# Patient Record
Sex: Female | Born: 1960 | Race: White | Hispanic: No | Marital: Married | Smoking: Never smoker
Health system: Southern US, Community
[De-identification: ages and names within clinical notes are randomized; demographics above are authoritative.]

## PROBLEM LIST (undated history)

## (undated) DIAGNOSIS — I10 Essential (primary) hypertension: Secondary | ICD-10-CM

## (undated) HISTORY — PX: CHOLECYSTECTOMY: SHX55

---

## 1997-09-26 ENCOUNTER — Other Ambulatory Visit: Admission: RE | Admit: 1997-09-26 | Discharge: 1997-09-26 | Payer: Self-pay | Admitting: Obstetrics & Gynecology

## 1999-11-20 ENCOUNTER — Encounter: Admission: RE | Admit: 1999-11-20 | Discharge: 1999-11-20 | Payer: Self-pay | Admitting: Obstetrics and Gynecology

## 1999-11-20 ENCOUNTER — Encounter: Payer: Self-pay | Admitting: Obstetrics and Gynecology

## 2002-03-23 ENCOUNTER — Encounter: Payer: Self-pay | Admitting: Obstetrics and Gynecology

## 2002-03-23 ENCOUNTER — Encounter: Admission: RE | Admit: 2002-03-23 | Discharge: 2002-03-23 | Payer: Self-pay | Admitting: Obstetrics and Gynecology

## 2002-10-19 ENCOUNTER — Other Ambulatory Visit: Admission: RE | Admit: 2002-10-19 | Discharge: 2002-10-19 | Payer: Self-pay | Admitting: Obstetrics and Gynecology

## 2003-10-22 ENCOUNTER — Other Ambulatory Visit: Admission: RE | Admit: 2003-10-22 | Discharge: 2003-10-22 | Payer: Self-pay | Admitting: Obstetrics and Gynecology

## 2004-10-23 ENCOUNTER — Encounter: Admission: RE | Admit: 2004-10-23 | Discharge: 2004-10-23 | Payer: Self-pay | Admitting: Obstetrics and Gynecology

## 2004-11-11 ENCOUNTER — Encounter: Admission: RE | Admit: 2004-11-11 | Discharge: 2004-11-11 | Payer: Self-pay | Admitting: Obstetrics and Gynecology

## 2005-11-19 ENCOUNTER — Encounter: Admission: RE | Admit: 2005-11-19 | Discharge: 2005-11-19 | Payer: Self-pay | Admitting: Obstetrics and Gynecology

## 2006-12-16 ENCOUNTER — Encounter: Admission: RE | Admit: 2006-12-16 | Discharge: 2006-12-16 | Payer: Self-pay | Admitting: Obstetrics and Gynecology

## 2006-12-23 ENCOUNTER — Encounter: Admission: RE | Admit: 2006-12-23 | Discharge: 2006-12-23 | Payer: Self-pay | Admitting: Obstetrics and Gynecology

## 2007-12-19 ENCOUNTER — Encounter: Admission: RE | Admit: 2007-12-19 | Discharge: 2007-12-19 | Payer: Self-pay | Admitting: Obstetrics and Gynecology

## 2008-12-23 ENCOUNTER — Encounter: Admission: RE | Admit: 2008-12-23 | Discharge: 2008-12-23 | Payer: Self-pay | Admitting: Obstetrics and Gynecology

## 2009-04-14 ENCOUNTER — Ambulatory Visit: Payer: Self-pay | Admitting: Family Medicine

## 2009-04-14 DIAGNOSIS — I10 Essential (primary) hypertension: Secondary | ICD-10-CM | POA: Insufficient documentation

## 2009-04-14 DIAGNOSIS — J309 Allergic rhinitis, unspecified: Secondary | ICD-10-CM | POA: Insufficient documentation

## 2009-04-14 DIAGNOSIS — L259 Unspecified contact dermatitis, unspecified cause: Secondary | ICD-10-CM | POA: Insufficient documentation

## 2009-06-30 ENCOUNTER — Ambulatory Visit: Payer: Self-pay | Admitting: Emergency Medicine

## 2009-12-30 ENCOUNTER — Encounter
Admission: RE | Admit: 2009-12-30 | Discharge: 2009-12-30 | Payer: Self-pay | Source: Home / Self Care | Attending: Obstetrics and Gynecology | Admitting: Obstetrics and Gynecology

## 2010-01-08 ENCOUNTER — Encounter
Admission: RE | Admit: 2010-01-08 | Discharge: 2010-01-08 | Payer: Self-pay | Source: Home / Self Care | Attending: Obstetrics and Gynecology | Admitting: Obstetrics and Gynecology

## 2010-02-08 ENCOUNTER — Encounter: Payer: Self-pay | Admitting: Obstetrics and Gynecology

## 2010-02-17 NOTE — Assessment & Plan Note (Signed)
Summary: FLEA BITES/KH   Vital Signs:  Patient Profile:   50 Years Old Female CC:      Flea bites, palms of hands, bottom of feet x 3 days Height:     62.5 inches Weight:      118 pounds O2 Sat:      98 % O2 treatment:    Room Air Temp:     98.5 degrees F oral Pulse rate:   88 / minute Pulse rhythm:   regular Resp:     12 per minute BP sitting:   136 / 78  (right arm) Cuff size:   regular  Vitals Entered By: Emilio Math (April 14, 2009 2:56 PM)                  Current Allergies: ! BACTRIMHistory of Present Illness Chief Complaint: Flea bites, palms of hands, bottom of feet x 3 days History of Present Illness: Subjective:  Patient complains of 2 day history of rash on hands and feet.  Describes as burning/itching.  She believes rash is from flea bites which she has had before. No lesions in mouth.  Feels well otherwise.  No fevers, chills, and sweats   Current Meds HYDROCHLOROTHIAZIDE 12.5 MG CAPS (HYDROCHLOROTHIAZIDE)  KELNOR 1/35 1-35 MG-MCG TABS (ETHYNODIOL DIAC-ETH ESTRADIOL)  FLONASE 50 MCG/ACT SUSP (FLUTICASONE PROPIONATE)  PREDNISONE 10 MG TABS (PREDNISONE) 2 PO BID for 2 days, then 1 BID for 2 days, then 1 daily for 2 days.  Take PC  REVIEW OF SYSTEMS Constitutional Symptoms      Denies fever, chills, night sweats, weight loss, weight gain, and fatigue.  Eyes       Denies change in vision, eye pain, eye discharge, glasses, contact lenses, and eye surgery. Ear/Nose/Throat/Mouth       Denies hearing loss/aids, change in hearing, ear pain, ear discharge, dizziness, frequent runny nose, frequent nose bleeds, sinus problems, sore throat, hoarseness, and tooth pain or bleeding.  Respiratory       Denies dry cough, productive cough, wheezing, shortness of breath, asthma, bronchitis, and emphysema/COPD.  Cardiovascular       Denies murmurs, chest pain, and tires easily with exhertion.    Gastrointestinal       Denies stomach pain, nausea/vomiting, diarrhea,  constipation, blood in bowel movements, and indigestion. Genitourniary       Denies painful urination, kidney stones, and loss of urinary control. Neurological       Denies paralysis, seizures, and fainting/blackouts. Musculoskeletal       Denies muscle pain, joint pain, joint stiffness, decreased range of motion, redness, swelling, muscle weakness, and gout.  Skin       Denies bruising, unusual mles/lumps or sores, and hair/skin or nail changes.      Comments: Flea bites Psych       Denies mood changes, temper/anger issues, anxiety/stress, speech problems, depression, and sleep problems.  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 Eyes:  Pupils are equal, round, and reactive to light and accomdation.  Extraocular movement is intact.  Conjunctivae are not inflamed.  Mouth:  No lesions Pharynx:  Normal  Neck:  Supple.  No adenopathy is present.   Lungs:  Clear to auscultation.  Breath sounds are equal.  Heart:  Regular rate and rhythm without murmurs, rubs, or gallops.  Abdomen:  Nontender without masses or hepatosplenomegaly.  Bowel sounds are present.  No CVA or flank tenderness.  Assessment New Problems: DERMATITIS (ICD-692.9) HYPERTENSION (ICD-401.9) ALLERGIC RHINITIS (ICD-477.9)  ? FLEA BITES;  ? HAND, FOOT, MOUTH DISEASE  Plan New Medications/Changes: PREDNISONE 10 MG TABS (PREDNISONE) 2 PO BID for 2 days, then 1 BID for 2 days, then 1 daily for 2 days.  Take PC  #14 x 0, 04/14/2009, Donna Christen MD  New Orders: New Patient Level III [phone] Planning Comments:   Tapering course of prednisone.  May take Benadryl at bedtime.   Follow-up  with dermatologist for persistent symptoms   The patient and/or caregiver has been counseled thoroughly with regard to medications prescribed including dosage, schedule, interactions, rationale for use, and possible side effects and they verbalize understanding.  Diagnoses and expected course of recovery discussed and will return if not improved as expected or if the condition worsens. Patient and/or caregiver verbalized understanding.  Prescriptions: PREDNISONE 10 MG TABS (PREDNISONE) 2 PO BID for 2 days, then 1 BID for 2 days, then 1 daily for 2 days.  Take PC  #14 x 0   Entered and Authorized by:   Donna Christen MD   Signed by:   Donna Christen MD on 04/14/2009   Method used:   Print then Give to Patient   RxID:   [id number]

## 2010-02-17 NOTE — Assessment & Plan Note (Signed)
Summary: VERTIGO   Vital Signs:  Patient Profile:   50 Years Old Female CC:       vertigo X this AM Height:     62.5 inches Weight:      121 pounds O2 Sat:      100 % O2 treatment:    Room Air Temp:     97.9 degrees F oral Pulse rate:   78 / minute Resp:     14 per minute BP sitting:   139 / 84  (right arm) Cuff size:   regular  Pt. in pain?   no  Vitals Entered By: Lajean Saver RN (June 30, 2009 3:33 PM)               Vision Screening: Left eye with correction: 20 / 25 Right eye with correction: 20 / 25 Both eyes with correction: 20 / 25  Color vision testing: normal      Vision Entered By: Lajean Saver RN (June 30, 2009 3:38 PM)    Updated Prior Medication List: HYDROCHLOROTHIAZIDE 12.5 MG CAPS (HYDROCHLOROTHIAZIDE)  [place] 1/35 1-35 MG-MCG TABS (ETHYNODIOL DIAC-ETH ESTRADIOL)  FLONASE 50 MCG/ACT SUSP (FLUTICASONE PROPIONATE)   Current Allergies (reviewed today): ! BACTRIMHistory of Present Illness History from: patient Chief Complaint:  vertigo X this AM History of Present Illness: Dizziness (room spinning) since this morning.  She has more symptoms with quick head movements.  Recent URI last week.  No other symptoms (no CP, SOB, AMS, confusion, or any other signs of CVA, no HA).  She has had vertigo in the past but this is lasting longer than usual.  She had to leave work and was driven here by family.  REVIEW OF SYSTEMS Constitutional Symptoms      Denies fever, chills, night sweats, weight loss, weight gain, and fatigue.      Comments: vertigo Eyes       Complains of change in vision.      Denies eye pain, eye discharge, glasses, contact lenses, and eye surgery.      Comments: blurred within last hour Ear/Nose/Throat/Mouth       Denies hearing loss/aids, change in hearing, ear pain, ear discharge, dizziness, frequent runny nose, frequent nose bleeds, sinus problems, sore throat, hoarseness, and tooth pain or bleeding.  Respiratory       Denies dry  cough, productive cough, wheezing, shortness of breath, asthma, bronchitis, and emphysema/COPD.  Cardiovascular       Denies murmurs, chest pain, and tires easily with exhertion.    Gastrointestinal       Denies stomach pain, nausea/vomiting, diarrhea, constipation, blood in bowel movements, and indigestion. Genitourniary       Denies painful urination, kidney stones, and loss of urinary control. Neurological       Denies paralysis, seizures, and fainting/blackouts. Musculoskeletal       Denies muscle pain, joint pain, joint stiffness, decreased range of motion, redness, swelling, muscle weakness, and gout.  Skin       Denies bruising, unusual mles/lumps or sores, and hair/skin or nail changes.  Psych       Denies mood changes, temper/anger issues, anxiety/stress, speech problems, depression, and sleep problems.  Past History:  Past Medical History: Reviewed history from 04/14/2009 and no changes required. Allergic rhinitis Hypertension  Past Surgical History: Cholecystectomy 04/01/2009  Family History: Reviewed history from 04/14/2009 and no changes required. Mother, HTN, A-fib Father, D, Liver CA  Social History: Reviewed history from 04/14/2009 and no changes required. 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  Mild horizontal nystagmus at lateral extremes, none vertically Pupils: equal, round, reactive to light Ears: normal, no lesions or deformities Nasal: mucosa pink, nonedematous, no septal deviation, turbinates normal Oral/Pharynx: tongue normal, posterior pharynx without erythema or exudate Neck: neck supple,  trachea midline, no masses Chest/Lungs: no rales, wheezes, or rhonchi bilateral, breath sounds equal without effort Heart: regular rate and  rhythm, no murmur Neurological: grossly intact and non-focal.  CN 2-12 intact Skin: no obvious  rashes or lesions MSE: oriented to time, place, and person Assessment New Problems: VERTIGO (ICD-780.4)   Plan New Medications/Changes: ANTIVERT 25 MG TABS (MECLIZINE HCL) 1 tab by mouth Q6 hours as needed for dizziness  #20 x 0, 06/30/2009, Hoyt Koch MD  New Orders: New Patient Level III [phone]  The patient and/or caregiver has been counseled thoroughly with regard to medications prescribed including dosage, schedule, interactions, rationale for use, and possible side effects and they verbalize understanding.  Diagnoses and expected course of recovery discussed and will return if not improved as expected or if the condition worsens. Patient and/or caregiver verbalized understanding.  Prescriptions: ANTIVERT 25 MG TABS (MECLIZINE HCL) 1 tab by mouth Q6 hours as needed for dizziness  #20 x 0   Entered and Authorized by:   Hoyt Koch MD   Signed by:   Hoyt Koch MD on 06/30/2009   Method used:   Handwritten   RxID:   [id number]   Patient Instructions: 1)  Rest, encourage hydration, no quick movement.  If dizziness gets worse or you develop new symptoms that are concerning for stroke, go to ER.  Your symptoms should gradually decrease in the next few hours - days.  Orders Added: 1)  New Patient Level III [95621]

## 2010-02-22 ENCOUNTER — Ambulatory Visit (INDEPENDENT_AMBULATORY_CARE_PROVIDER_SITE_OTHER): Payer: BC Managed Care – PPO | Admitting: Family Medicine

## 2010-02-22 ENCOUNTER — Encounter: Payer: Self-pay | Admitting: Family Medicine

## 2010-02-22 DIAGNOSIS — J069 Acute upper respiratory infection, unspecified: Secondary | ICD-10-CM

## 2010-02-24 ENCOUNTER — Telehealth (INDEPENDENT_AMBULATORY_CARE_PROVIDER_SITE_OTHER): Payer: Self-pay | Admitting: *Deleted

## 2010-03-05 NOTE — Progress Notes (Signed)
  Phone Note Outgoing Call   Call placed by: Clemens Catholic LPN,  February 24, 2010 3:45 PM Call placed to: Patient Summary of Call: call back: pt states that she is starting to feel better, her fever is gone. advised her to complete her ABT and call back if any questions or concerns. Initial call taken by: Clemens Catholic LPN,  February 24, 2010 3:45 PM

## 2010-03-05 NOTE — Assessment & Plan Note (Signed)
Summary: congestion/sinus problems/wse Room 5   Vital Signs:  Patient Profile:   50 Years Old Female CC:      Congestion, cough x 6 days Height:     62.5 inches Weight:      121 pounds O2 Sat:      99 % O2 treatment:    Room Air Temp:     98.8 degrees F oral Pulse rate:   100 / minute Pulse rhythm:   regular Resp:     16 per minute BP sitting:   139 / 84  (left arm) Cuff size:   regular  Vitals Entered By: Emilio Math (February 22, 2010 5:12 PM)                  Current Allergies: ! BACTRIM ! * LISINOPRILHistory of Present Illness Chief Complaint: Congestion, cough x 6 days History of Present Illness:  Subjective: Patient complains of URI symptoms that started 5 days ago with a cough, mild sore throat and sinus congestion.  The cough has persisted and is partly productive   No pleuritic pain No wheezing + post-nasal drainage No sinus pain/pressure No itchy/red eyes ? earache No hemoptysis No SOB + intermittent fever/chills No nausea No vomiting No abdominal pain No diarrhea No skin rashes +fatigue No myalgias No headache Used OTC meds without relief   Current Meds HYDROCHLOROTHIAZIDE 12.5 MG CAPS (HYDROCHLOROTHIAZIDE)  KELNOR 1/35 1-35 MG-MCG TABS (ETHYNODIOL DIAC-ETH ESTRADIOL)  FLONASE 50 MCG/ACT SUSP (FLUTICASONE PROPIONATE)  ANTIVERT 25 MG TABS (MECLIZINE HCL) 1 tab by mouth Q6 hours as needed for dizziness LOSARTAN POTASSIUM 25 MG TABS (LOSARTAN POTASSIUM)  VERAPAMIL HCL 120 MG TABS (VERAPAMIL HCL)  SPRINTEC 28 0.25-35 MG-MCG TABS (NORGESTIMATE-ETH ESTRADIOL)  METAMUCIL 30.9 % POWD (PSYLLIUM)  AZITHROMYCIN 250 MG TABS (AZITHROMYCIN) Two tabs by mouth on day 1, then 1 tab daily on days 2 through 5 (Rx void after 03/02/10)  REVIEW OF SYSTEMS Constitutional Symptoms       Complains of fever and chills.     Denies night sweats, weight loss, weight gain, and fatigue.  Eyes       Denies change in vision, eye pain, eye discharge, glasses, contact  lenses, and eye surgery. Ear/Nose/Throat/Mouth       Complains of ear pain, sinus problems, and hoarseness.      Denies hearing loss/aids, change in hearing, ear discharge, dizziness, frequent runny nose, frequent nose bleeds, sore throat, and tooth pain or bleeding.  Respiratory       Complains of dry cough.      Denies productive cough, wheezing, shortness of breath, asthma, bronchitis, and emphysema/COPD.  Cardiovascular       Denies murmurs, chest pain, and tires easily with exhertion.    Gastrointestinal       Denies stomach pain, nausea/vomiting, diarrhea, constipation, blood in bowel movements, and indigestion. Genitourniary       Denies painful urination, kidney stones, and loss of urinary control. Neurological       Complains of headaches.      Denies paralysis, seizures, and fainting/blackouts. Musculoskeletal       Denies muscle pain, joint pain, joint stiffness, decreased range of motion, redness, swelling, muscle weakness, and gout.  Skin       Denies bruising, unusual mles/lumps or sores, and hair/skin or nail changes.  Psych       Denies mood changes, temper/anger issues, anxiety/stress, speech problems, depression, and sleep problems.  Past History:  Past Medical History: Reviewed history from  04/14/2009 and no changes required. Allergic rhinitis Hypertension  Past Surgical History: Reviewed history from 06/30/2009 and no changes required. Cholecystectomy 04/01/2009  Family History: Reviewed history from 04/14/2009 and no changes required. Mother, HTN, A-fib Father, D, Liver CA  Social History: Reviewed history from 04/14/2009 and no changes required. 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  Extraocular movement is intact.  Conjunctivae are not inflamed.  Ears:  Canals normal.  Tympanic membranes  normal.   Nose:  Normal septum.  Normal turbinates, mildly congested.   No sinus tenderness present.  Pharynx:  Normal  Neck:  Supple.  Slightly tender shotty posterior nodes are palpated bilaterally.  Lungs:  Clear to auscultation.  Breath sounds are equal.  Heart:  Regular rate and rhythm without murmurs, rubs, or gallops.  Abdomen:  Nontender without masses or hepatosplenomegaly.  Bowel sounds are present.  No CVA or flank tenderness.  Extremities:  No edema.  Assessment New Problems: UPPER RESPIRATORY INFECTION, ACUTE (ICD-465.9)  NO EVIDENCE BACTERIAL INFECTION TODAY  Plan New Medications/Changes: AZITHROMYCIN 250 MG TABS (AZITHROMYCIN) Two tabs by mouth on day 1, then 1 tab daily on days 2 through 5 (Rx void after 03/02/10)  #6 tabs x 0, 02/22/2010, Donna Christen MD  New Orders: Pulse Oximetry (single measurment) [94760] Est. Patient Level III [84132] Planning Comments:   Treat symptomatically for now:  Increase fluid intake, begin expectorant and topical decongestant, cough suppressant at bedtime.  If fever/chills/sweats persist, or if not improving 5 days begin Z-pack (given Rx to hold).  Followup with PCP if not improving 7 to 10 days.   The patient and/or caregiver has been counseled thoroughly with regard to medications prescribed including dosage, schedule, interactions, rationale for use, and possible side effects and they verbalize understanding.  Diagnoses and expected course of recovery discussed and will return if not improved as expected or if the condition worsens. Patient and/or caregiver verbalized understanding.  Prescriptions: AZITHROMYCIN 250 MG TABS (AZITHROMYCIN) Two tabs by mouth on day 1, then 1 tab daily on days 2 through 5 (Rx void after 03/02/10)  #6 tabs x 0   Entered and Authorized by:   Donna Christen MD   Signed by:   Donna Christen MD on 02/22/2010   Method used:   Print then Give to Patient   RxID:   [id number]   Patient Instructions: 1)  Take  plain Robitussin daytime with plenty of fluids.  May take Robitussin DM at bedtime. 2)  Increase fluid intake, rest. 3)  May use Afrin nasal spray (or generic oxymetazoline) twice daily for about 5 days.  Also recommend using saline nasal spray several times daily and/or saline nasal irrigation. 4)  Begin Azithromycin if not improving about 5 days or if persistent fever develops. 5)  Followup with family doctor if not improving 7 to 10 days.   Orders Added: 1)  Pulse Oximetry (single measurment) [94760] 2)  Est. Patient Level III [47425]

## 2010-11-27 ENCOUNTER — Emergency Department
Admission: EM | Admit: 2010-11-27 | Discharge: 2010-11-27 | Disposition: A | Discharge status: Home / Self Care | Payer: BC Managed Care – PPO | Source: Home / Self Care | Attending: Family Medicine | Admitting: Family Medicine

## 2010-11-27 ENCOUNTER — Encounter: Payer: Self-pay | Admitting: *Deleted

## 2010-11-27 DIAGNOSIS — J069 Acute upper respiratory infection, unspecified: Secondary | ICD-10-CM

## 2010-11-27 DIAGNOSIS — J029 Acute pharyngitis, unspecified: Secondary | ICD-10-CM

## 2010-11-27 HISTORY — DX: Essential (primary) hypertension: I10

## 2010-11-27 LAB — POCT RAPID STREP A (OFFICE): Rapid Strep A Screen: NEGATIVE

## 2010-11-27 MED ORDER — AZITHROMYCIN 250 MG PO TABS: ORAL_TABLET | ORAL | Status: AC

## 2010-11-27 MED ORDER — HYDROCOD POLST-CHLORPHEN POLST 10-8 MG/5ML PO LQCR: 5.0000 mL | Freq: Every evening | ORAL | Status: DC | PRN

## 2010-11-27 NOTE — ED Notes (Signed)
Sore throat and fever x 2 days. Little bloody drainage from nose.

## 2010-11-27 NOTE — Discharge Instructions (Signed)
Take Mucinex  (guaifenesin) twice daily for congestion.  Increase fluid intake, rest. May use Afrin nasal spray (or generic oxymetazoline) twice daily for about 5 days.  Also recommend using saline nasal spray several times daily and/or saline nasal irrigation.  Use Flonase nasal spray after using Afrin Stop all antihistamines for now, and other non-prescription cough/cold preparations. May take Ibuprofen 200mg , 4 tabs every 8 hours with food for chest/sternum discomfort. Begin Azithromycin if not improving about 5 days or if persistent fever develops. Follow-up with family doctor if not improving 7 to 10 days.

## 2010-11-27 NOTE — ED Provider Notes (Addendum)
History     CSN: [id number] Arrival date & time: 11/27/2010  9:07 AM   First MD Initiated Contact with Patient 11/27/10 0900      Complains of sinus congestion and sore throat for two days, worse on left side.     Patient is a 50 y.o. female presenting with pharyngitis. The history is provided by the patient.  Sore Throat This is a new problem. The problem occurs constantly. The problem has been gradually worsening. The symptoms are aggravated by nothing. The symptoms are relieved by nothing. Treatments tried: Ibuprofen. The treatment provided mild relief.    Past Medical History  Diagnosis Date  . Hypertension   . Tinnitus     Past Surgical History  Procedure Date  . Cholecystectomy     Family History  Problem Relation Age of Onset  . Hypertension Father   . Atrial fibrillation Father     History  Substance Use Topics  . Smoking status: Never Smoker   . Smokeless tobacco: Not on file  . Alcohol Use: No    OB History    Grav Para Term Preterm Abortions TAB SAB Ect Mult Living                  Review of Systems  Constitutional: Positive for fever, chills and fatigue.  HENT: Positive for congestion, sore throat, rhinorrhea, neck stiffness and sinus pressure.   Eyes: Negative.   Respiratory: Positive for cough and chest tightness.   Cardiovascular: Negative.   Gastrointestinal: Negative.   Genitourinary: Negative.   Musculoskeletal: Positive for myalgias.  Skin: Negative.   Neurological: Negative for dizziness.  Hematological: Positive for adenopathy.    Allergies  Lisinopril and Sulfamethoxazole w/trimethoprim  Home Medications   Current Outpatient Rx  Name Route Sig Dispense Refill  . THERA TEARS NUTRITION PO Oral Take by mouth.      Marland Kitchen LOSARTAN POTASSIUM 25 MG PO TABS Oral Take 25 mg by mouth daily.      . AZITHROMYCIN 250 MG PO TABS  Take 2 tabs today; then begin one tab once daily for 4 more days.  Rx void after 12/05/10 DEA # [id number] 6 each 0   . HYDROCOD POLST-CHLORPHEN POLST 10-8 MG/5ML PO LQCR Oral Take 5 mLs by mouth at bedtime as needed. DEA # [id number] 115 mL 0    BP 130/76  Pulse 97  Temp(Src) 99.4 F (37.4 C) (Oral)  Resp 14  Ht 5' 1.5" (1.562 m)  Wt 123 lb 6.4 oz (55.974 kg)  BMI 22.94 kg/m2  SpO2 100%  Physical Exam  Constitutional: She is oriented to person, place, and time. She appears well-developed and well-nourished. No distress.  HENT:  Head: Normocephalic.  Right Ear: External ear normal.  Left Ear: External ear normal.  Nose: Mucosal edema and rhinorrhea present. No sinus tenderness.  Mouth/Throat: Posterior oropharyngeal edema present. No oropharyngeal exudate.  Eyes: Conjunctivae and EOM are normal. Pupils are equal, round, and reactive to light. Right eye exhibits no discharge. Left eye exhibits no discharge.  Neck: Normal range of motion. Neck supple.  Cardiovascular: Normal rate, regular rhythm and normal heart sounds.   Pulmonary/Chest: Effort normal and breath sounds normal. She exhibits tenderness.  Abdominal: Soft. Bowel sounds are normal. There is no tenderness.  Musculoskeletal: She exhibits no edema.  Lymphadenopathy:    She has cervical adenopathy.       Right cervical: Posterior cervical adenopathy present.       Left cervical: Posterior cervical adenopathy present.  Neurological: She is alert and oriented to person, place, and time.  Skin: Skin is warm and dry. She is not diaphoretic.    ED Course  Procedures    Labs Reviewed  POCT RAPID STREP A (OFFICE)  Negative  STREP B DNA PROBE pending      1. Acute upper respiratory infections of unspecified site   2. Acute pharyngitis       MDM  No evidence bacterial infection today. Throat culture pending Treat symptomatically for now:  Increase fluid intake, begin expectorant, topical decongestant, cough suppressant at bedtime.  If fever/chills persist, or if not improving 5 days begin Z-pack (given Rx to hold).  Followup  with PCP if not improving 7 to 10 days.         Donna Christen, MD 11/27/10 1004  Donna Christen, MD 11/27/10 1351

## 2010-11-28 LAB — STREP A DNA PROBE: GASP: NEGATIVE

## 2010-12-24 ENCOUNTER — Other Ambulatory Visit: Payer: Self-pay | Admitting: Obstetrics and Gynecology

## 2010-12-24 DIAGNOSIS — Z1231 Encounter for screening mammogram for malignant neoplasm of breast: Secondary | ICD-10-CM

## 2011-01-26 ENCOUNTER — Ambulatory Visit
Admission: RE | Admit: 2011-01-26 | Discharge: 2011-01-26 | Disposition: A | Discharge status: Home / Self Care | Payer: BC Managed Care – PPO | Source: Ambulatory Visit | Attending: Obstetrics and Gynecology | Admitting: Obstetrics and Gynecology

## 2011-01-26 DIAGNOSIS — Z1231 Encounter for screening mammogram for malignant neoplasm of breast: Secondary | ICD-10-CM

## 2012-01-20 ENCOUNTER — Other Ambulatory Visit: Payer: Self-pay | Admitting: Obstetrics and Gynecology

## 2012-01-20 DIAGNOSIS — Z139 Encounter for screening, unspecified: Secondary | ICD-10-CM

## 2012-01-27 ENCOUNTER — Ambulatory Visit (INDEPENDENT_AMBULATORY_CARE_PROVIDER_SITE_OTHER): Payer: BC Managed Care – PPO

## 2012-01-27 DIAGNOSIS — Z139 Encounter for screening, unspecified: Secondary | ICD-10-CM

## 2012-01-27 DIAGNOSIS — Z1231 Encounter for screening mammogram for malignant neoplasm of breast: Secondary | ICD-10-CM

## 2013-01-17 ENCOUNTER — Other Ambulatory Visit: Payer: Self-pay | Admitting: Obstetrics and Gynecology

## 2013-01-17 DIAGNOSIS — Z1231 Encounter for screening mammogram for malignant neoplasm of breast: Secondary | ICD-10-CM

## 2013-01-30 ENCOUNTER — Ambulatory Visit (INDEPENDENT_AMBULATORY_CARE_PROVIDER_SITE_OTHER): Payer: BC Managed Care – PPO

## 2013-01-30 DIAGNOSIS — Z1231 Encounter for screening mammogram for malignant neoplasm of breast: Secondary | ICD-10-CM

## 2014-02-27 ENCOUNTER — Other Ambulatory Visit: Payer: Self-pay | Admitting: Obstetrics and Gynecology

## 2014-02-27 DIAGNOSIS — R928 Other abnormal and inconclusive findings on diagnostic imaging of breast: Secondary | ICD-10-CM

## 2014-03-08 ENCOUNTER — Ambulatory Visit
Admission: RE | Admit: 2014-03-08 | Discharge: 2014-03-08 | Disposition: A | Discharge status: Home / Self Care | Payer: BLUE CROSS/BLUE SHIELD | Source: Ambulatory Visit | Attending: Obstetrics and Gynecology | Admitting: Obstetrics and Gynecology

## 2014-03-08 DIAGNOSIS — R928 Other abnormal and inconclusive findings on diagnostic imaging of breast: Secondary | ICD-10-CM

## 2017-05-30 ENCOUNTER — Other Ambulatory Visit: Payer: Self-pay | Admitting: Obstetrics and Gynecology

## 2017-05-30 DIAGNOSIS — R928 Other abnormal and inconclusive findings on diagnostic imaging of breast: Secondary | ICD-10-CM

## 2017-06-01 ENCOUNTER — Other Ambulatory Visit: Payer: BLUE CROSS/BLUE SHIELD

## 2017-06-02 ENCOUNTER — Ambulatory Visit
Admission: RE | Admit: 2017-06-02 | Discharge: 2017-06-02 | Disposition: A | Discharge status: Home / Self Care | Payer: 59 | Source: Ambulatory Visit | Attending: Obstetrics and Gynecology | Admitting: Obstetrics and Gynecology

## 2017-06-02 ENCOUNTER — Ambulatory Visit: Payer: Self-pay

## 2017-06-02 DIAGNOSIS — R928 Other abnormal and inconclusive findings on diagnostic imaging of breast: Secondary | ICD-10-CM

## 2019-02-02 ENCOUNTER — Other Ambulatory Visit: Payer: Self-pay

## 2019-02-02 ENCOUNTER — Emergency Department (INDEPENDENT_AMBULATORY_CARE_PROVIDER_SITE_OTHER)
Admission: EM | Admit: 2019-02-02 | Discharge: 2019-02-02 | Disposition: A | Discharge status: Home / Self Care | Payer: BC Managed Care – PPO | Source: Home / Self Care

## 2019-02-02 ENCOUNTER — Encounter: Payer: Self-pay | Admitting: *Deleted

## 2019-02-02 DIAGNOSIS — L42 Pityriasis rosea: Secondary | ICD-10-CM

## 2019-02-02 DIAGNOSIS — R21 Rash and other nonspecific skin eruption: Secondary | ICD-10-CM

## 2019-02-02 MED ORDER — FLUCONAZOLE 150 MG PO TABS: 150.0000 mg | ORAL_TABLET | Freq: Every day | ORAL | 0 refills | Status: AC

## 2019-02-02 NOTE — ED Provider Notes (Signed)
[facility]    CSN: [id number] Arrival date & time: 02/02/19  0853      History   Chief Complaint Chief Complaint  Patient presents with  . Rash    HPI Elizabeth Hays is a 59 y.o. female.   This is the initial visit for this 59 year old woman to [place] [facility].  She is complaining of rash.  Pt c/o itching rash at her underwear and bra line x 4 wks. She also c/o burning, painful rash around her mid abd x 3-4 days. Hydrocortisone cream helps a bit.  Water in the shower is irritating.     Past Medical History:  Diagnosis Date  . Hypertension   . Tinnitus     Patient Active Problem List   Diagnosis Date Noted  . HYPERTENSION 04/14/2009  . ALLERGIC RHINITIS 04/14/2009  . DERMATITIS 04/14/2009    Past Surgical History:  Procedure Laterality Date  . CHOLECYSTECTOMY      OB History   No obstetric history on file.      Home Medications    Prior to Admission medications   Medication Sig Start Date End Date Taking? Authorizing Provider  DHA-EPA-Flaxseed Oil-Vitamin E (THERA TEARS NUTRITION PO) Take by mouth.     Yes [provider]  losartan (COZAAR) 25 MG tablet Take 25 mg by mouth daily.     Yes [provider]  fluconazole (DIFLUCAN) 150 MG tablet Take 1 tablet (150 mg total) by mouth daily. 02/02/19   Robyn Haber, MD    Family History Family History  Problem Relation Age of Onset  . Hypertension Father   . Atrial fibrillation Father     Social History Social History   Tobacco Use  . Smoking status: Never Smoker  . Smokeless tobacco: Never Used  Substance Use Topics  . Alcohol use: No  . Drug use: No     Allergies   Lisinopril, Sulfamethoxazole-trimethoprim, and Other   Review of Systems Review of Systems  Skin: Positive for rash.     Physical Exam Triage Vital Signs ED Triage Vitals  Enc Vitals Group     BP      Pulse      Resp      Temp      Temp src      SpO2      Weight    Height      Head Circumference      Peak Flow      Pain Score      Pain Loc      Pain Edu?      Excl. in [place]?    No data found.  Updated Vital Signs BP (!) 148/78 (BP Location: Right Arm)   Pulse 86   Temp 97.9 F (36.6 C) (Oral)   Resp 16   Ht 5\' 2"  (1.575 m)   Wt 62.1 kg   SpO2 98%   BMI 25.06 kg/m    Physical Exam Vitals and nursing note reviewed.  Constitutional:      Appearance: Normal appearance. She is normal weight.  Eyes:     Conjunctiva/sclera: Conjunctivae normal.  Cardiovascular:     Rate and Rhythm: Normal rate.  Pulmonary:     Effort: Pulmonary effort is normal.  Musculoskeletal:        General: Normal range of motion.     Cervical back: Normal range of motion and neck supple.  Skin:    Findings: Rash present.     Comments:  Overall, dermatomal bilateral patches on trunk with predominant confluent erythema along bra strap  Neurological:     General: No focal deficit present.     Mental Status: She is alert.  Psychiatric:        Mood and Affect: Mood normal.      UC Treatments / Results  Labs (all labs ordered are listed, but only abnormal results are displayed) Labs Reviewed - No data to display  EKG   Radiology No results found.  Procedures Procedures (including critical care time)  Medications Ordered in UC Medications - No data to display  Initial Impression / Assessment and Plan / UC Course  I have reviewed the triage vital signs and the nursing notes.  Pertinent labs & imaging results that were available during my care of the patient were reviewed by me and considered in my medical decision making (see chart for details).    Final Clinical Impressions(s) / UC Diagnoses   Final diagnoses:  Pityriasis rosea  Rash     Discharge Instructions     Zyrtec 10 mg at night is helpful to control the itching.    ED Prescriptions    Medication Sig Dispense Auth. Provider   fluconazole (DIFLUCAN) 150 MG tablet Take 1 tablet  (150 mg total) by mouth daily. 7 tablet Elvina Sidle, MD     I have reviewed the PDMP during this encounter.   Elvina Sidle, MD 02/02/19 [phone]

## 2019-02-02 NOTE — Discharge Instructions (Addendum)
Zyrtec 10 mg at night is helpful to control the itching.

## 2019-02-02 NOTE — ED Triage Notes (Signed)
Pt c/o itching rash at her underwear and bra line x 4 wks. She also c/o burning, painful rash around her mid abd x 3-4 days.
